# Patient Record
Sex: Female | Born: 1990 | Hispanic: Yes | Marital: Single | Smoking: Former smoker
Health system: Southern US, Community
[De-identification: ages and names within clinical notes are randomized; demographics above are authoritative.]

## PROBLEM LIST (undated history)

## (undated) DIAGNOSIS — L309 Dermatitis, unspecified: Secondary | ICD-10-CM

---
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
## 2008-12-05 ENCOUNTER — Emergency Department: Payer: Self-pay | Admitting: Emergency Medicine

## 2009-05-27 ENCOUNTER — Emergency Department: Payer: Self-pay | Admitting: Internal Medicine

## 2009-06-25 ENCOUNTER — Emergency Department: Payer: Self-pay | Admitting: Emergency Medicine

## 2009-07-01 ENCOUNTER — Emergency Department: Payer: Self-pay | Admitting: Emergency Medicine

## 2009-07-12 ENCOUNTER — Emergency Department: Payer: Self-pay

## 2009-12-05 ENCOUNTER — Emergency Department: Payer: Self-pay | Admitting: Emergency Medicine

## 2010-09-22 ENCOUNTER — Inpatient Hospital Stay: Payer: Self-pay | Admitting: Obstetrics and Gynecology

## 2011-07-21 ENCOUNTER — Emergency Department: Payer: Self-pay | Admitting: Emergency Medicine

## 2012-08-22 ENCOUNTER — Emergency Department: Payer: Self-pay | Admitting: Emergency Medicine

## 2012-08-22 LAB — RAPID INFLUENZA A&B ANTIGENS

## 2013-09-11 ENCOUNTER — Emergency Department: Payer: Self-pay | Admitting: Internal Medicine

## 2014-10-03 ENCOUNTER — Emergency Department: Payer: Self-pay | Admitting: Emergency Medicine

## 2014-10-21 ENCOUNTER — Emergency Department: Payer: Self-pay | Admitting: Emergency Medicine

## 2015-05-19 ENCOUNTER — Emergency Department
Admission: EM | Admit: 2015-05-19 | Discharge: 2015-05-19 | Disposition: A | Discharge status: Home / Self Care | Payer: Medicaid Other | Attending: Emergency Medicine | Admitting: Emergency Medicine

## 2015-05-19 DIAGNOSIS — L309 Dermatitis, unspecified: Secondary | ICD-10-CM | POA: Insufficient documentation

## 2015-05-19 DIAGNOSIS — Z720 Tobacco use: Secondary | ICD-10-CM | POA: Insufficient documentation

## 2015-05-19 DIAGNOSIS — Z91040 Latex allergy status: Secondary | ICD-10-CM | POA: Insufficient documentation

## 2015-05-19 HISTORY — DX: Dermatitis, unspecified: L30.9

## 2015-05-19 MED ORDER — TRIAMCINOLONE ACETONIDE 0.5 % EX OINT: 1.0000 "application " | TOPICAL_OINTMENT | Freq: Two times a day (BID) | CUTANEOUS | Status: DC

## 2015-05-19 NOTE — ED Provider Notes (Signed)
[facility] Emergency Department Provider Note  ____________________________________________  Time seen: Approximately 10:57 AM  I have reviewed the triage vital signs and the nursing notes.   HISTORY  Chief Complaint Rash   HPI Taylor Doyle is a 24 y.o. female is here complaining of bilateral hand rash. Patient states that she has had this before and came to the emergency room where she was prescribed a cream that helped with this. She has not seen a skin specialist in the past nor she have a primary care doctor. Patient states that this is been steadily increasing in the last month. She denies any fever or chills. There is been no drainage from these areas. She does have a history of eczema and impetigo.Rash constant and currently is confined to the dorsum of the hand and fingers. She states she uses some Neosporin ointment which did not relieve any of her symptoms. Currently she rates her discomfort is an 8 out of 10.   Past Medical History  Diagnosis Date  . Eczema     There are no active problems to display for this patient.   History reviewed. No pertinent past surgical history.  Current Outpatient Rx  Name  Route  Sig  Dispense  Refill  . triamcinolone ointment (KENALOG) 0.5 %   Topical   Apply 1 application topically 2 (two) times daily.   60 g   0     Allergies Latex  No family history on file.  Social History Social History  Substance Use Topics  . Smoking status: Current Every Day Smoker  . Smokeless tobacco: None  . Alcohol Use: No    Review of Systems Constitutional: No fever/chills Eyes: No visual changes. Cardiovascular: Denies chest pain. Respiratory: Denies shortness of breath. Gastrointestinal:  No nausea, no vomiting. Musculoskeletal: Negative for back pain. Skin: Positive for rash Neurological: Negative for headaches, focal weakness or numbness.  10-point ROS otherwise  negative.  ____________________________________________   PHYSICAL EXAM:  VITAL SIGNS: ED Triage Vitals  Enc Vitals Group     BP 05/19/15 1034 118/81 mmHg     Pulse Rate 05/19/15 1034 82     Resp 05/19/15 1034 16     Temp 05/19/15 1034 98.6 F (37 C)     Temp Source 05/19/15 1034 Oral     SpO2 05/19/15 1034 97 %     Weight 05/19/15 1034 170 lb (77.111 kg)     Height 05/19/15 1034 5' (1.524 m)     Head Cir --      Peak Flow --      Pain Score 05/19/15 1034 8     Pain Loc --      Pain Edu? --      Excl. in GC? --     Constitutional: Alert and oriented. Well appearing and in no acute distress. Eyes: Conjunctivae are normal. PERRL. EOMI. Head: Atraumatic. Nose: No congestion/rhinnorhea. Neck: No stridor.   Cardiovascular: Normal rate, regular rhythm. Grossly normal heart sounds.  Good peripheral circulation. Respiratory: Normal respiratory effort.  No retractions. Lungs CTAB. Gastrointestinal: Soft and nontender. No distention.  Musculoskeletal: No lower extremity tenderness nor edema.  No joint effusions. Neurologic:  Normal speech and language. No gross focal neurologic deficits are appreciated. No gait instability. Skin:  Skin is warm, dry.  Dorsum of the hands and fingers bilaterally are seen with thick, scaly macular areas. There is a dry appearance and feel to these areas. There is no active drainage noted and no warmth  or redness seen. Psychiatric: Mood and affect are normal. Speech and behavior are normal.  ____________________________________________   LABS (all labs ordered are listed, but only abnormal results are displayed)  Labs Reviewed - No data to display ____________________________________________   PROCEDURES  Procedure(s) performed: None  Critical Care performed: No  ____________________________________________   INITIAL IMPRESSION / ASSESSMENT AND PLAN / ED COURSE  Pertinent labs & imaging results that were available during my care of the  patient were reviewed by me and considered in my medical decision making (see chart for details).  Patient was placed on Kenalog 0.5% cream to apply to area twice a day. She is also given the name of [facility] to follow-up with for her eczema. ____________________________________________   FINAL CLINICAL IMPRESSION(S) / ED DIAGNOSES  Final diagnoses:  Eczema of both hands      Tommi Rumps, PA-C 05/19/15 1342  Tommi Rumps, PA-C 05/19/15 1343  Sharyn Creamer, MD 05/19/15 [phone]

## 2015-05-19 NOTE — ED Notes (Signed)
States her right hand swollen and tender  ..some rash and cracking noted..has been treated for same in past

## 2015-05-19 NOTE — ED Notes (Signed)
Pt c/o rash to BL hands with hx of impetigo and eczema

## 2015-05-19 NOTE — Discharge Instructions (Signed)
Hand Dermatitis Hand dermatitis is a skin problem. Small, itchy, raised dots or blisters appear on the palms of the hands. Hand dermatitis can last 3 to 4 weeks. HOME CARE  Avoid washing your hands too much.  Avoid all harsh chemicals. Wear gloves when you use products that can bother your skin.  Use medicated cream (1% hydrocortisone cream) at least 2 to 4 times per day.  Only take medicine as told by your doctor.  You may use wet cloths (compresses) or cold packs. GET HELP RIGHT AWAY IF:   The rash is not better after 1 week of treatment.  The area is red, tender, or yellowish-white fluid (pus) comes from the wound.  The rash is spreading. MAKE SURE YOU:   Understand these instructions.  Will watch your condition.  Will get help right away if you are not doing well or get worse.   This information is not intended to replace advice given to you by your health care provider. Make sure you discuss any questions you have with your health care provider.   Document Released: 10/16/2009 Document Revised: 10/14/2011 Document Reviewed: 02/03/2015 Elsevier Interactive Patient Education 2016 [employer]    Call for an appointment at [facility]. Use cream as directed and apply a very thin layer and rubbed completely into the skin.

## 2016-11-06 ENCOUNTER — Ambulatory Visit
Admission: EM | Admit: 2016-11-06 | Discharge: 2016-11-06 | Disposition: A | Discharge status: Home / Self Care | Payer: 59 | Attending: Family Medicine | Admitting: Family Medicine

## 2016-11-06 ENCOUNTER — Encounter: Payer: Self-pay | Admitting: Emergency Medicine

## 2016-11-06 ENCOUNTER — Ambulatory Visit (INDEPENDENT_AMBULATORY_CARE_PROVIDER_SITE_OTHER): Payer: 59

## 2016-11-06 DIAGNOSIS — J111 Influenza due to unidentified influenza virus with other respiratory manifestations: Secondary | ICD-10-CM

## 2016-11-06 DIAGNOSIS — J181 Lobar pneumonia, unspecified organism: Secondary | ICD-10-CM

## 2016-11-06 DIAGNOSIS — R69 Illness, unspecified: Secondary | ICD-10-CM

## 2016-11-06 DIAGNOSIS — J189 Pneumonia, unspecified organism: Secondary | ICD-10-CM

## 2016-11-06 LAB — RAPID STREP SCREEN (MED CTR MEBANE ONLY): Streptococcus, Group A Screen (Direct): NEGATIVE

## 2016-11-06 MED ORDER — BENZONATATE 100 MG PO CAPS: 100.0000 mg | ORAL_CAPSULE | Freq: Three times a day (TID) | ORAL | 0 refills | Status: DC | PRN

## 2016-11-06 MED ORDER — AZITHROMYCIN 250 MG PO TABS: ORAL_TABLET | ORAL | 0 refills | Status: DC

## 2016-11-06 MED ORDER — ACETAMINOPHEN 500 MG PO TABS
1000.0000 mg | ORAL_TABLET | Freq: Once | ORAL | Status: AC
  Administered 2016-11-06: 1000 mg via ORAL

## 2016-11-06 MED ORDER — HYDROCOD POLST-CPM POLST ER 10-8 MG/5ML PO SUER: 5.0000 mL | Freq: Every evening | ORAL | 0 refills | Status: DC | PRN

## 2016-11-06 NOTE — Discharge Instructions (Signed)
Take medication as prescribed. Rest. Drink plenty of fluids. Take over the counter tylenol or ibuprofen as needed.   Follow up with your primary care physician this week. Return to Urgent care or Emergency room for chest pain, shortness of breath, uncontrolled fevers, new or worsening concerns.

## 2016-11-06 NOTE — ED Provider Notes (Signed)
[facility] ____________________________________________  Time seen: Approximately 2:25 PM  I have reviewed the triage vital signs and the nursing notes.   HISTORY  Chief Complaint Generalized Body Aches and Cough  HPI Taylor Doyle is a 26 y.o. female presenting for the complaints of 4 days of runny nose, nasal congestion, chills, body aches and subjective fever. Patient reports has intermittently taking over-the-counter Robitussin and Tylenol with some improvement, no resolution. Reports has not taken any medications today for the same complaints. Patient reports over the last 2 days that she has had overall body aches. Patient reports when she lies flat she can hear what she feels like a gurgle in her chest. Patient reports she has also been having some soreness to her chest from frequent coughing. States soreness in her chest is to both sides, left worse than right and sore to direct palpation, movement as well as big deep breaths. Denies any chest pain at this time. Denies any pain at rest. Denies any wheezing or shortness of breath. States has been coughing up intermittent whitish yellowish phlegm as well as blowing her nose. States occasional sore throat, denies current sore throat. Denies known sick contacts. Denies hemoptysis.  Denies chest pain, shortness of breath, abdominal pain, dysuria, extremity pain, extremity swelling or rash. Denies recent sickness. Denies recent antibiotic use. Denies recent immobilization or sedentary changes. Denies leg swelling, cancer history, palpitations, shortness of breath. Denies any previous history of PE or DVT. Denies any family history of PE or DVT.  No LMP recorded. Patient has had an implant. Denies pregnancy.   Past Medical History:  Diagnosis Date  . Eczema   denies other past medical history  There are no active problems to display for this patient.   History reviewed. No pertinent surgical history.   No current  facility-administered medications for this encounter.   Current Outpatient Prescriptions:  .  etonogestrel (IMPLANON) 68 MG IMPL implant, 1 each by Subdermal route once., Disp: , Rfl:  .  azithromycin (ZITHROMAX Z-PAK) 250 MG tablet, Take 2 tablets (500 mg) on  Day 1,  followed by 1 tablet (250 mg) once daily on Days 2 through 5., Disp: 6 each, Rfl: 0 .  benzonatate (TESSALON PERLES) 100 MG capsule, Take 1 capsule (100 mg total) by mouth 3 (three) times daily as needed for cough., Disp: 15 capsule, Rfl: 0 .  chlorpheniramine-HYDROcodone (TUSSIONEX PENNKINETIC ER) 10-8 MG/5ML SUER, Take 5 mLs by mouth at bedtime as needed for cough. do not drive or operate machinery while taking as can cause drowsiness., Disp: 75 mL, Rfl: 0  Allergies Latex  History reviewed. No pertinent family history.  Social History Social History  Substance Use Topics  . Smoking status: Former Games developer  . Smokeless tobacco: Never Used  . Alcohol use No    Review of Systems Constitutional: As above. Eyes: No visual changes. ENT: Mild sore throat. Cardiovascular: Denies chest pain. Respiratory: Denies shortness of breath. Gastrointestinal: No abdominal pain.  No nausea, no vomiting.  No diarrhea.  No constipation. Genitourinary: Negative for dysuria. Musculoskeletal: Negative for back pain. Skin: Negative for rash. Neurological: Negative for focal weakness or numbness.   ____________________________________________   PHYSICAL EXAM:  VITAL SIGNS: ED Triage Vitals [11/06/16 1344]  Enc Vitals Group     BP 119/64     Pulse Rate (!) 116     Resp 16     Temp 99.7 F (37.6 C)     Temp Source Oral     SpO2  99 %     Weight 165 lb (74.8 kg)     Height 5' (1.524 m)     Head Circumference      Peak Flow      Pain Score 8     Pain Loc      Pain Edu?      Excl. in GC?    Vitals:   11/06/16 1344 11/06/16 1505  BP: 119/64   Pulse: (!) 116 96  Resp: 16   Temp: 99.7 F (37.6 C) 98.9 F (37.2 C)    TempSrc: Oral Oral  SpO2: 99%   Weight: 165 lb (74.8 kg)   Height: 5' (1.524 m)     Constitutional: Alert and oriented. Well appearing and in no acute distress. Eyes: Conjunctivae are normal. PERRL. EOMI. Head: Atraumatic. No sinus tenderness to palpation. No swelling. No erythema.  Ears: no erythema, normal TMs bilaterally.   Nose:Nasal congestion with clear rhinorrhea  Mouth/Throat: Mucous membranes are moist. Mild pharyngeal erythema. No tonsillar swelling or exudate.  Neck: No stridor.  No cervical spine tenderness to palpation. Hematological/Lymphatic/Immunilogical: No cervical lymphadenopathy. Cardiovascular: Normal rate, regular rhythm. Grossly normal heart sounds.  Good peripheral circulation. Respiratory: Normal respiratory effort.  No retractions. Good air movement. Mild left lower rhonchi. No wheezing. Dry intermittent cough noted in room. Speaks in complete sentences. Gastrointestinal: Soft and nontender.  No CVA tenderness. Musculoskeletal: Ambulatory with steady gait. No cervical, thoracic or lumbar tenderness to palpation. Bilateral lower extremities nontender, no edema noted. Bilateral pedal pulses equal and easily palpated. Neurologic:  Normal speech and language. No gait instability. Skin:  Skin appears warm, dry and intact. No rash noted. Psychiatric: Mood and affect are normal. Speech and behavior are normal. ___________________________________________   LABS (all labs ordered are listed, but only abnormal results are displayed)  Labs Reviewed  RAPID STREP SCREEN (NOT AT [facility])  CULTURE, GROUP A STREP [facility])   RADIOLOGY  Dg Chest 2 View  Result Date: 11/06/2016 CLINICAL DATA:  Cough. EXAM: CHEST  2 VIEW COMPARISON:  None. FINDINGS: The heart size and mediastinal contours are within normal limits. No pneumothorax or pleural effusion is noted. Right lung is clear. Mild left basilar atelectasis or infiltrate is noted. The visualized skeletal structures are  unremarkable. IMPRESSION: Left basilar atelectasis or pneumonia is noted. Electronically Signed   By: Lupita Raider, M.D.   On: 11/06/2016 14:43   ____________________________________________   PROCEDURES Procedures    INITIAL IMPRESSION / ASSESSMENT AND PLAN / ED COURSE  Pertinent labs & imaging results that were available during my care of the patient were reviewed by me and considered in my medical decision making (see chart for details).  Well-appearing patient. No acute distress. Discussed in detail patient suspect influenza-like illness. Patient does have rhonchi left lower lobe, discussed the patient will evaluate chest x-ray as well as strep swab. Discussed in detail with patient as patient reports chest soreness with deep breath but also present with muscular movements and palpation, suspect muscle strain and costochondritis however discussed etiologies including up to pulmonary embolism. Counseled regarding this. Discussed use of d-dimer testing, patient verbalized understanding of lab testing and use, and declines d-dimer testing. Will evaluate chest x-ray. 1 g of Tylenol given orally in urgent care.   Quick strep negative, will culture. Chest x-ray per radiologist's left basilar atelectasis or pneumonia noted. Discussed with patient concern for atypical pneumonia due to presentation of recent influenza-like illness. Will treat patient with oral azithromycin, when necessary Tessalon Perles  and when necessary Tussionex. Work note given for today and tomorrow. Encourage rest, fluids, supportive care, over-the-counter Tylenol or ibuprofen as needed. Patient does reports feeling somewhat better after Tylenol in urgent care. Encouraged patient to follow-up with her primary care physician this week.Discussed indication, risks and benefits of medications with patient. Discussed very strict follow-up and return parameters with patient.Discussed indication, risks and benefits of medications with  patient.  Discussed follow up with Primary care physician this week. Discussed follow up and return parameters including no resolution or any worsening concerns. Patient verbalized understanding and agreed to plan.   ____________________________________________   FINAL CLINICAL IMPRESSION(S) / ED DIAGNOSES  Final diagnoses:  Pneumonia of left lower lobe due to infectious organism [facility])  Influenza-like illness     Discharge Medication List as of 11/06/2016  2:58 PM    START taking these medications   Details  azithromycin (ZITHROMAX Z-PAK) 250 MG tablet Take 2 tablets (500 mg) on  Day 1,  followed by 1 tablet (250 mg) once daily on Days 2 through 5., Normal    benzonatate (TESSALON PERLES) 100 MG capsule Take 1 capsule (100 mg total) by mouth 3 (three) times daily as needed for cough., Starting Wed 11/06/2016, Normal    chlorpheniramine-HYDROcodone (TUSSIONEX PENNKINETIC ER) 10-8 MG/5ML SUER Take 5 mLs by mouth at bedtime as needed for cough. do not drive or operate machinery while taking as can cause drowsiness., Starting Wed 11/06/2016, Print        Note: This dictation was prepared with Dragon dictation along with smaller phrase technology. Any transcriptional errors that result from this process are unintentional.         Renford Dills, NP 11/06/16 1830

## 2016-11-06 NOTE — ED Triage Notes (Signed)
Patient c/o cough, chest congestion, and bodyaches that started on Saturday.

## 2016-11-09 LAB — CULTURE, GROUP A STREP (THRC)

## 2017-01-13 ENCOUNTER — Emergency Department: Payer: 59

## 2017-01-13 ENCOUNTER — Emergency Department
Admission: EM | Admit: 2017-01-13 | Discharge: 2017-01-13 | Disposition: A | Discharge status: Home / Self Care | Payer: 59 | Attending: Emergency Medicine | Admitting: Emergency Medicine

## 2017-01-13 ENCOUNTER — Encounter: Payer: Self-pay | Admitting: Emergency Medicine

## 2017-01-13 DIAGNOSIS — Z91040 Latex allergy status: Secondary | ICD-10-CM | POA: Diagnosis not present

## 2017-01-13 DIAGNOSIS — J069 Acute upper respiratory infection, unspecified: Secondary | ICD-10-CM | POA: Insufficient documentation

## 2017-01-13 DIAGNOSIS — R05 Cough: Secondary | ICD-10-CM | POA: Diagnosis present

## 2017-01-13 MED ORDER — HYDROCOD POLST-CPM POLST ER 10-8 MG/5ML PO SUER: 5.0000 mL | Freq: Two times a day (BID) | ORAL | 0 refills | Status: DC | PRN

## 2017-01-13 NOTE — ED Notes (Signed)
See triage note  Presents with nasal congestion,body aches and cough 2 days ago  Unsure of fever  Afebrile on arrival

## 2017-01-13 NOTE — ED Provider Notes (Signed)
[facility] Emergency Department Provider Note  ____________________________________________  Time seen: Approximately 1851  I have reviewed the triage vital signs and the nursing notes.   HISTORY  Chief Complaint Cough and Generalized Body Aches   HPI Taylor Doyle is a 26 y.o. female who presents to the emergency department for evaluationof nasal congestion, body aches, and cough that started yesterday morning. She does not believe that she has had a fever. She does report having had pneumonia approximately 6 weeks ago and has not had a follow-up chest x-ray. She has not taken anything for her symptoms today.   Past Medical History:  Diagnosis Date  . Eczema     There are no active problems to display for this patient.   No past surgical history on file.  Prior to Admission medications   Medication Sig Start Date End Date Taking? Authorizing Provider  azithromycin (ZITHROMAX Z-PAK) 250 MG tablet Take 2 tablets (500 mg) on  Day 1,  followed by 1 tablet (250 mg) once daily on Days 2 through 5. 11/06/16   Renford DillsMiller, Lindsey, NP  benzonatate (TESSALON PERLES) 100 MG capsule Take 1 capsule (100 mg total) by mouth 3 (three) times daily as needed for cough. 11/06/16   Renford DillsMiller, Lindsey, NP  chlorpheniramine-HYDROcodone [facility](TUSSIONEX PENNKINETIC ER) 10-8 MG/5ML SUER Take 5 mLs by mouth every 12 (twelve) hours as needed for cough. do not drive or operate machinery while taking as can cause drowsiness. 01/13/17   Elizabethann Lackey, Rulon Eisenmengerari B, FNP  etonogestrel (IMPLANON) 68 MG IMPL implant 1 each by Subdermal route once.    [provider]    Allergies Latex  No family history on file.  Social History Social History  Substance Use Topics  . Smoking status: Former Games developermoker  . Smokeless tobacco: Never Used  . Alcohol use No    Review of Systems Constitutional: Negative fever/chills ENT: Negative for sore throat. Cardiovascular: Denies chest pain. Respiratory:  Negative for shortness of breath. Positive for cough. Gastrointestinal: Negative for nausea,  no vomiting.  No diarrhea.  Musculoskeletal: Positive for body aches Skin: Negative for rash. Neurological: Negative for headaches ____________________________________________   PHYSICAL EXAM:  VITAL SIGNS: ED Triage Vitals  Enc Vitals Group     BP 01/13/17 1834 122/67     Pulse Rate 01/13/17 1834 97     Resp 01/13/17 1834 18     Temp 01/13/17 1834 98.6 F (37 C)     Temp Source 01/13/17 1834 Oral     SpO2 01/13/17 1834 99 %     Weight 01/13/17 1835 170 lb (77.1 kg)     Height 01/13/17 1835 5' (1.524 m)     Head Circumference --      Peak Flow --      Pain Score 01/13/17 1833 10     Pain Loc --      Pain Edu? --      Excl. in GC? --     Constitutional: Alert and oriented. Acutely ill appearing and in no acute distress. Eyes: Conjunctivae are normal. EOMI. Ears: Bilateral tympanic membranes are normal Nose: Pansinus congestion noted; clear rhinnorhea. Mouth/Throat: Mucous membranes are moist.  Oropharynx mildly erythematous. Tonsils not visualized. Neck: No stridor.  Lymphatic: No cervical lymphadenopathy. Cardiovascular: Normal rate, regular rhythm. Good peripheral circulation. Respiratory: Normal respiratory effort.  No retractions. Breath sounds clear to auscultation throughout. Gastrointestinal: Soft and nontender.  Musculoskeletal: FROM x 4 extremities.  Neurologic:  Normal speech and language.  Skin:  Skin is  warm, dry and intact. No rash noted. Psychiatric: Mood and affect are normal. Speech and behavior are normal.  ____________________________________________   LABS (all labs ordered are listed, but only abnormal results are displayed)  Labs Reviewed - No data to display ____________________________________________  EKG   ____________________________________________  RADIOLOGY  Chest x-ray negative for acute cardiopulmonary abnormality per  radiology. ____________________________________________   PROCEDURES  Procedure(s) performed: None  Critical Care performed: No ____________________________________________   INITIAL IMPRESSION / ASSESSMENT AND PLAN / ED COURSE  26 year-old female presenting to the emergency department for evaluation and treatment of symptoms consistent with a viral upper respiratory tract infection. She'll be treated with Tussionex and advised to follow-up with her primary care provider for symptoms that are not improving over the next few days. She was advised to return to the emergency department for symptoms that change or worsen if she is unable schedule an appointment.  Pertinent labs & imaging results that were available during my care of the patient were reviewed by me and considered in my medical decision making (see chart for details).  Discharge Medication List as of 01/13/2017  8:48 PM      If controlled substance prescribed during this visit, 12 month history viewed on the [facility] prior to issuing an initial prescription for Schedule II or III opiod. ____________________________________________   FINAL CLINICAL IMPRESSION(S) / ED DIAGNOSES  Final diagnoses:  Upper respiratory tract infection, unspecified type    Note:  This document was prepared using Dragon voice recognition software and may include unintentional dictation errors.     Chinita Pester, FNP 01/13/17 2345    Myrna Blazer, MD 01/14/17 [phone]

## 2017-01-13 NOTE — ED Triage Notes (Signed)
Pt reports cough, nasal congestion and body aches for two days.

## 2017-06-28 IMAGING — CR DG CHEST 2V
2 series · 2 of 2 positions shown · non-contrast
Comparison: 11/06/2016

CLINICAL DATA: Nasal congestion with body aches and cough 2 days.

EXAM:
CHEST  2 VIEW

[chest pa]
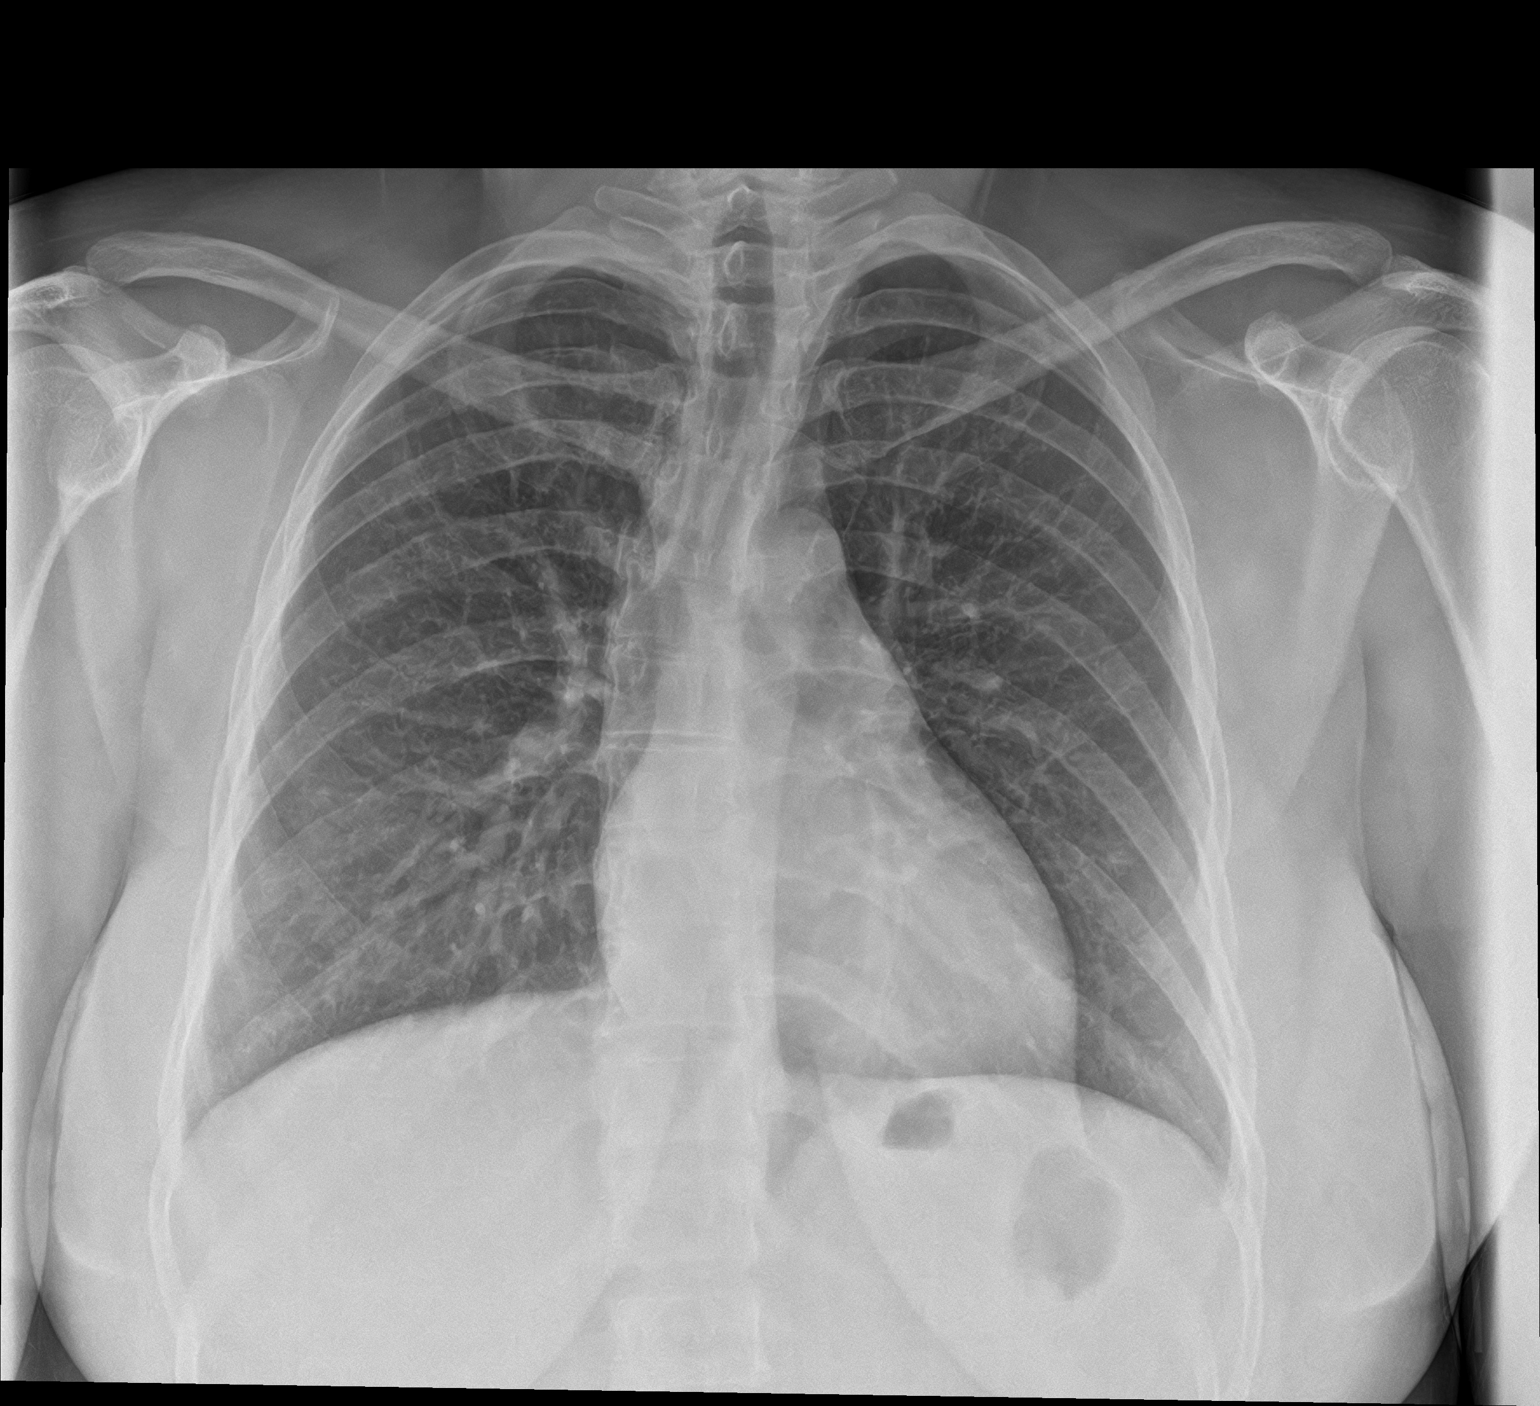

[chest lat]
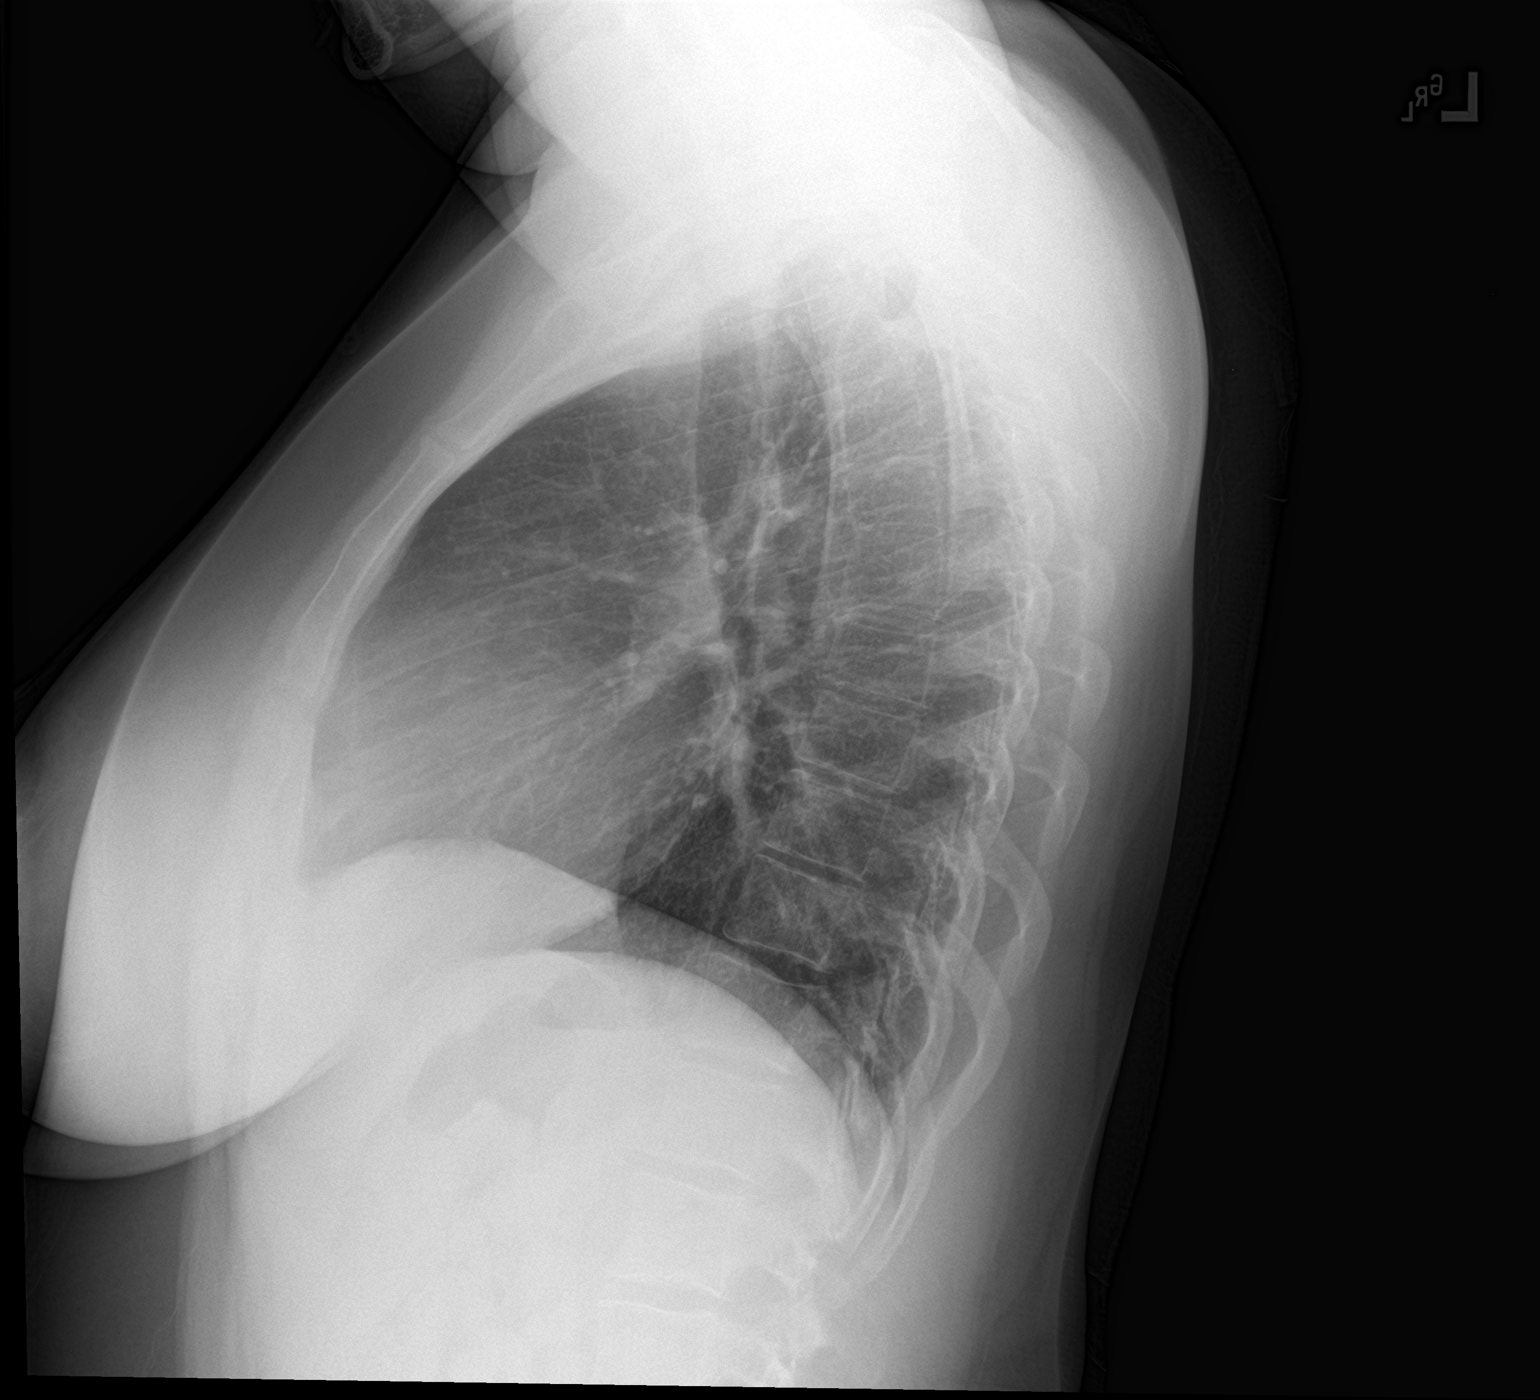

[2 of 2 positions shown; findings below may reference images not displayed]

FINDINGS: Lungs are adequately inflated and otherwise clear. Cardiomediastinal
silhouette is within normal. Bones and soft tissues unchanged.
IMPRESSION: No active cardiopulmonary disease.

## 2017-10-03 ENCOUNTER — Other Ambulatory Visit: Payer: Self-pay | Admitting: Obstetrics and Gynecology

## 2017-10-03 DIAGNOSIS — N6452 Nipple discharge: Secondary | ICD-10-CM

## 2019-07-22 DIAGNOSIS — Z20828 Contact with and (suspected) exposure to other viral communicable diseases: Secondary | ICD-10-CM | POA: Diagnosis not present

## 2019-08-12 DIAGNOSIS — Z20828 Contact with and (suspected) exposure to other viral communicable diseases: Secondary | ICD-10-CM | POA: Diagnosis not present

## 2019-09-16 DIAGNOSIS — Z20828 Contact with and (suspected) exposure to other viral communicable diseases: Secondary | ICD-10-CM | POA: Diagnosis not present

## 2020-02-18 DIAGNOSIS — E6609 Other obesity due to excess calories: Secondary | ICD-10-CM | POA: Diagnosis not present

## 2020-02-18 DIAGNOSIS — Z136 Encounter for screening for cardiovascular disorders: Secondary | ICD-10-CM | POA: Diagnosis not present

## 2020-02-18 DIAGNOSIS — Z131 Encounter for screening for diabetes mellitus: Secondary | ICD-10-CM | POA: Diagnosis not present

## 2020-02-18 DIAGNOSIS — Z0001 Encounter for general adult medical examination with abnormal findings: Secondary | ICD-10-CM | POA: Diagnosis not present

## 2020-02-18 DIAGNOSIS — Z1329 Encounter for screening for other suspected endocrine disorder: Secondary | ICD-10-CM | POA: Diagnosis not present

## 2020-02-18 DIAGNOSIS — R1013 Epigastric pain: Secondary | ICD-10-CM | POA: Diagnosis not present

## 2020-03-01 DIAGNOSIS — E782 Mixed hyperlipidemia: Secondary | ICD-10-CM | POA: Diagnosis not present

## 2020-03-01 DIAGNOSIS — E6609 Other obesity due to excess calories: Secondary | ICD-10-CM | POA: Diagnosis not present

## 2020-03-01 DIAGNOSIS — R1013 Epigastric pain: Secondary | ICD-10-CM | POA: Diagnosis not present

## 2020-03-16 DIAGNOSIS — Z124 Encounter for screening for malignant neoplasm of cervix: Secondary | ICD-10-CM | POA: Diagnosis not present

## 2020-03-16 DIAGNOSIS — Z113 Encounter for screening for infections with a predominantly sexual mode of transmission: Secondary | ICD-10-CM | POA: Diagnosis not present

## 2020-03-16 DIAGNOSIS — N898 Other specified noninflammatory disorders of vagina: Secondary | ICD-10-CM | POA: Diagnosis not present

## 2020-03-16 DIAGNOSIS — Z01411 Encounter for gynecological examination (general) (routine) with abnormal findings: Secondary | ICD-10-CM | POA: Diagnosis not present

## 2020-06-20 DIAGNOSIS — Z3490 Encounter for supervision of normal pregnancy, unspecified, unspecified trimester: Secondary | ICD-10-CM | POA: Diagnosis not present

## 2020-06-20 DIAGNOSIS — N941 Unspecified dyspareunia: Secondary | ICD-10-CM | POA: Diagnosis not present

## 2020-07-17 DIAGNOSIS — Z3481 Encounter for supervision of other normal pregnancy, first trimester: Secondary | ICD-10-CM | POA: Diagnosis not present

## 2020-07-31 DIAGNOSIS — O99810 Abnormal glucose complicating pregnancy: Secondary | ICD-10-CM | POA: Diagnosis not present

## 2020-08-15 DIAGNOSIS — Z23 Encounter for immunization: Secondary | ICD-10-CM | POA: Diagnosis not present

## 2020-08-29 DIAGNOSIS — Z113 Encounter for screening for infections with a predominantly sexual mode of transmission: Secondary | ICD-10-CM | POA: Diagnosis not present

## 2020-08-29 DIAGNOSIS — Z3401 Encounter for supervision of normal first pregnancy, first trimester: Secondary | ICD-10-CM | POA: Diagnosis not present

## 2020-11-24 DIAGNOSIS — Z23 Encounter for immunization: Secondary | ICD-10-CM | POA: Diagnosis not present

## 2020-12-08 DIAGNOSIS — O99810 Abnormal glucose complicating pregnancy: Secondary | ICD-10-CM | POA: Diagnosis not present

## 2023-12-02 ENCOUNTER — Encounter: Payer: Self-pay | Admitting: Dermatology

## 2023-12-02 ENCOUNTER — Ambulatory Visit (INDEPENDENT_AMBULATORY_CARE_PROVIDER_SITE_OTHER): Payer: 59 | Admitting: Dermatology

## 2023-12-02 VITALS — BP 103/69 | HR 101

## 2023-12-02 DIAGNOSIS — L300 Nummular dermatitis: Secondary | ICD-10-CM

## 2023-12-02 DIAGNOSIS — L209 Atopic dermatitis, unspecified: Secondary | ICD-10-CM

## 2023-12-02 MED ORDER — CLOBETASOL PROPIONATE 0.05 % EX OINT: 1.0000 | TOPICAL_OINTMENT | Freq: Two times a day (BID) | CUTANEOUS | 4 refills | Status: DC

## 2023-12-02 MED ORDER — PIMECROLIMUS 1 % EX CREA: TOPICAL_CREAM | Freq: Two times a day (BID) | CUTANEOUS | 2 refills | Status: DC

## 2023-12-02 NOTE — Patient Instructions (Signed)

## 2023-12-02 NOTE — Progress Notes (Signed)
   New Patient Visit   Subjective  Taylor Doyle is a 33 y.o. female who presents for the following: Concerned about eczema on her hands. 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 Previously treated with steroid shots, triamcinolone  and clobetasol and failed therapy.  States she has had eczema since childhood. 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    The following portions of the chart were reviewed this encounter and updated as appropriate: medications, allergies, medical history  Review of Systems:  No other skin or systemic complaints except as noted in HPI or Assessment and Plan.  Objective  Well appearing patient in no apparent distress; mood and affect are within normal limits.  A focused examination was performed of the following areas: Hands and face  Relevant exam findings are noted in the Assessment and Plan.    Assessment & Plan    ATOPIC DERMATITIS Exam: Scaly pink papules coalescing to plaques 5% BSA  Flared  Atopic dermatitis (eczema) is a chronic, relapsing, pruritic condition that can significantly affect quality of life. It is often associated with allergic rhinitis and/or asthma and can require treatment with topical medications, phototherapy, or in severe cases biologic injectable medication (Dupixent; Adbry) or Oral JAK inhibitors.  Treatment Plan: Elidel BID  Continue clobetasol If she fails by next visit we will proceed with Dupixent  Recommend gentle skin care.  NUMMULAR ECZEMA Left Hand - Anterior, Left Hand - Posterior, Right Hand - Anterior, Right Hand - Posterior Related Medications pimecrolimus (ELIDEL) 1 % cream Apply topically 2 (two) times daily. clobetasol ointment (TEMOVATE) 0.05 % Apply 1 Application topically 2 (two) times daily.  Return in about 1 month (around 01/01/2024) for F/u up with Dr. Myrtie Atkinson.  I, Haig Levan, Surg Tech III, am acting as scribe for Deneise Finlay, MD.    Documentation: I have reviewed the above documentation for accuracy and completeness, and I agree with the above.  Deneise Finlay, MD

## 2023-12-24 ENCOUNTER — Other Ambulatory Visit: Payer: Self-pay

## 2023-12-24 DIAGNOSIS — L300 Nummular dermatitis: Secondary | ICD-10-CM

## 2023-12-24 MED ORDER — CLOBETASOL PROPIONATE 0.05 % EX OINT: 1.0000 | TOPICAL_OINTMENT | Freq: Two times a day (BID) | CUTANEOUS | 4 refills | Status: DC

## 2023-12-24 MED ORDER — PIMECROLIMUS 1 % EX CREA: TOPICAL_CREAM | Freq: Two times a day (BID) | CUTANEOUS | 2 refills | Status: DC

## 2024-01-14 ENCOUNTER — Encounter: Payer: Self-pay | Admitting: Dermatology

## 2024-01-14 ENCOUNTER — Ambulatory Visit (INDEPENDENT_AMBULATORY_CARE_PROVIDER_SITE_OTHER): Admitting: Dermatology

## 2024-01-14 VITALS — BP 102/69

## 2024-01-14 DIAGNOSIS — L309 Dermatitis, unspecified: Secondary | ICD-10-CM | POA: Diagnosis not present

## 2024-01-14 DIAGNOSIS — L300 Nummular dermatitis: Secondary | ICD-10-CM

## 2024-01-14 MED ORDER — BETAMETHASONE DIPROPIONATE 0.05 % EX CREA: TOPICAL_CREAM | Freq: Two times a day (BID) | CUTANEOUS | 3 refills | Status: AC

## 2024-01-14 MED ORDER — TACROLIMUS 0.1 % EX OINT: TOPICAL_OINTMENT | CUTANEOUS | 3 refills | Status: AC

## 2024-01-14 NOTE — Patient Instructions (Addendum)
 Date: Wed Jan 14 2024  Dear Ms. Taylor Doyle,  Thank you for visiting today. Here is a summary of the key instructions:  - Medications:   - Betamethasone cream for hand dermatitis flares     - Apply for 2 weeks, then take a break   - Tacrolimus cream     - Alternate with betamethasone every 2 weeks until clear  - Skin Care Instructions:   - Wear gloves when washing dishes and cleaning   - Use lukewarm water if not wearing gloves   - Apply moisturizing cream after washing hands   - Limit use of alcohol-based hand sanitizers   - Use warm water, soap, and moisturizer instead of sanitizers  - Follow-up:   - Return for follow-up appointment in 8 weeks   - If hand dermatitis is not clear, Dupixent may be considered  - Additional Instructions:   - Send a message through MyChart if you have issues with prescriptions   - Be mindful of everything you touch to avoid triggering hand dermatitis  Please reach out if you have any questions or concerns.  Best regards,  Dr. Louana Roup [facility]   Important Information  Due to recent changes in healthcare laws, you may see results of your pathology and/or laboratory studies on MyChart before the doctors have had a chance to review them. We understand that in some cases there may be results that are confusing or concerning to you. Please understand that not all results are received at the same time and often the doctors may need to interpret multiple results in order to provide you with the best plan of care or course of treatment. Therefore, we ask that you please give us  2 business days to thoroughly review all your results before contacting the office for clarification. Should we see a critical lab result, you will be contacted sooner.   If You Need Anything After Your Visit  If you have any questions or concerns for your doctor, please call our main line at [phone] If no one answers, please leave a voicemail as directed and we will  return your call as soon as possible. Messages left after 4 pm will be answered the following business day.   You may also send us  a message via MyChart. We typically respond to MyChart messages within 1-2 business days.  For prescription refills, please ask your pharmacy to contact our office. Our fax number is [phone].  If you have an urgent issue when the clinic is closed that cannot wait until the next business day, you can page your doctor at the number below.    Please note that while we do our best to be available for urgent issues outside of office hours, we are not available 24/7.   If you have an urgent issue and are unable to reach us , you may choose to seek medical care at your doctor's office, retail clinic, urgent care center, or emergency room.  If you have a medical emergency, please immediately call 911 or go to the emergency department. In the event of inclement weather, please call our main line at [phone] for an update on the status of any delays or closures.  Dermatology Medication Tips: Please keep the boxes that topical medications come in in order to help keep track of the instructions about where and how to use these. Pharmacies typically print the medication instructions only on the boxes and not directly on the medication tubes.   If your medication is  too expensive, please contact our office at [phone] or send us  a message through MyChart.   We are unable to tell what your co-pay for medications will be in advance as this is different depending on your insurance coverage. However, we may be able to find a substitute medication at lower cost or fill out paperwork to get insurance to cover a needed medication.   If a prior authorization is required to get your medication covered by your insurance company, please allow us  1-2 business days to complete this process.  Drug prices often vary depending on where the prescription is filled and some pharmacies  may offer cheaper prices.  The website www.goodrx.com contains coupons for medications through different pharmacies. The prices here do not account for what the cost may be with help from insurance (it may be cheaper with your insurance), but the website can give you the price if you did not use any insurance.  - You can print the associated coupon and take it with your prescription to the pharmacy.  - You may also stop by our office during regular business hours and pick up a GoodRx coupon card.  - If you need your prescription sent electronically to a different pharmacy, notify our office through [facility] or by phone at [phone]

## 2024-01-14 NOTE — Progress Notes (Signed)
   Follow-Up Visit   Subjective  Taylor Doyle is a 33 y.o. female who presents for the following: Nummular Eczema  Patient present today for follow up visit. Patient was last evaluated on 12/02/23 by Dr .Paci. At this visit patient was prescribed:  Pimecrolimus  - apply BID Clobetasol  - BID for 2 weeks, then stop for 2 weeks.   Patient said she was not able to obtain either Rx due to insurance stating a PA is needed.   Patient reports sxs are unchanged. Patient denies medication changes.   The following portions of the chart were reviewed this encounter and updated as appropriate: medications, allergies, medical history  Review of Systems:  No other skin or systemic complaints except as noted in HPI or Assessment and Plan.  Objective  Well appearing patient in no apparent distress; mood and affect are within normal limits.   A focused examination was performed of the following areas: hands   Relevant exam findings are noted in the Assessment and Plan.             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 Previous prescriptions for clobetasol  and pimecrolimus  were not covered by insurance, necessitating medication changes. The condition is persistent and requires ongoing management with topical treatments.  - Plan:    Adjust topical regimen:     - Switch to betamethasone cream (covered by Medicaid) as a substitute for clobetasol      - Prescribe tacrolimus as an alternative to pimecrolimus      - Alternate betamethasone and tacrolimus every 2 weeks until clear     - Use steroid cream (betamethasone) early in a flare for up to 2 weeks, then take a break    Consider future treatment options:     - If creams are ineffective after 6-12 weeks, consider Dupixent (injectable monoclonal antibody)    Patient education:      - Wear gloves when washing dishes and cleaning     - If gloves unavailable, use lukewarm water and avoid touching detergent directly     - Apply moisturizing cream after washing hands     - Limit contact with alcohol-based hand sanitizers     - Use warm water, soap, and moisturizer instead of hand sanitizers when possible    Medication management:     - Send new prescriptions to patient's pharmacy    Follow-up:     - Appointment in 8 weeks     - Assess effectiveness of current treatment     - Consider Dupixent if condition not clear  NUMMULAR ECZEMA   Related Medications betamethasone dipropionate 0.05 % cream Apply topically 2 (two) times daily. Use on affected areas for 2 weeks, then break for 2 weeks. tacrolimus (PROTOPIC) 0.1 % ointment Use BID on affected areas for 2 weeks while breaking from betamethasone.  Return in about 8 weeks (around 03/10/2024) for nummular eczema.   Documentation: I have reviewed the above documentation for accuracy and completeness, and I agree with the above.   I, Shirron Louanne Roussel, CMA, am acting as scribe for [employer], DO.   Louana Roup, DO

## 2024-01-19 ENCOUNTER — Encounter: Payer: Self-pay | Admitting: Dermatology

## 2024-03-24 ENCOUNTER — Encounter: Payer: Self-pay | Admitting: Dermatology

## 2024-03-24 ENCOUNTER — Ambulatory Visit: Admitting: Dermatology

## 2024-03-24 DIAGNOSIS — L300 Nummular dermatitis: Secondary | ICD-10-CM

## 2024-03-24 DIAGNOSIS — L299 Pruritus, unspecified: Secondary | ICD-10-CM

## 2024-03-24 MED ORDER — OPZELURA 1.5 % EX CREA: TOPICAL_CREAM | CUTANEOUS | 4 refills | Status: AC

## 2024-03-24 NOTE — Patient Instructions (Addendum)
 Date: Wed Mar 24 2024  Hello Taylor Doyle,  Thank you for visiting today. Here is a summary of the key instructions:  - Medications:   - Apply Opzelura  cream to affected areas daily until clear, then every other night   - Use a pea-sized amount   - At night, put Vaseline on top after applying Opzelura    - Continue using tacrolimus  and clobetasol  until switching to Opzelura    - Use the Opzelura  sample provided  - Skin Care:   - Apply cream 30 minutes before washing hands   - Apply cream at night after showering   - Wear gloves when washing dishes or cleaning  - Follow-up:   - Return for follow-up appointment in 3 months  - Other Instructions:   - New prescription for Opzelura  will be sent to [facility] pharmacy  Please reach out if you have any questions or concerns.  Warm regards,  Dr. Delon Lenis [facility]at[facility]  Important Information  Due to recent changes in healthcare laws, you may see results of your pathology and/or laboratory studies on MyChart before the doctors have had a chance to review them. We understand that in some cases there may be results that are confusing or concerning to you. Please understand that not all results are received at the same time and often the doctors may need to interpret multiple results in order to provide you with the best plan of care or course of treatment. Therefore, we ask that you please give us  2 business days to thoroughly review all your results before contacting the office for clarification. Should we see a critical lab result, you will be contacted sooner.   If You Need Anything After Your Visit  If you have any questions or concerns for your doctor, please call our main line at [phone] If no one answers, please leave a voicemail as directed and we will return your call as soon as possible. Messages left after 4 pm will be answered the following business day.   You may also send us  a message via MyChart. We typically respond to  MyChart messages within 1-2 business days.  For prescription refills, please ask your pharmacy to contact our office. Our fax number is [phone].  If you have an urgent issue when the clinic is closed that cannot wait until the next business day, you can page your doctor at the number below.    Please note that while we do our best to be available for urgent issues outside of office hours, we are not available 24/7.   If you have an urgent issue and are unable to reach us , you may choose to seek medical care at your doctor's office, retail clinic, urgent care center, or emergency room.  If you have a medical emergency, please immediately call 911 or go to the emergency department. In the event of inclement weather, please call our main line at [phone] for an update on the status of any delays or closures.  Dermatology Medication Tips: Please keep the boxes that topical medications come in in order to help keep track of the instructions about where and how to use these. Pharmacies typically print the medication instructions only on the boxes and not directly on the medication tubes.   If your medication is too expensive, please contact our office at [phone] or send us  a message through MyChart.   We are unable to tell what your co-pay for medications will be in advance as this is different depending on  your insurance coverage. However, we may be able to find a substitute medication at lower cost or fill out paperwork to get insurance to cover a needed medication.   If a prior authorization is required to get your medication covered by your insurance company, please allow us  1-2 business days to complete this process.  Drug prices often vary depending on where the prescription is filled and some pharmacies may offer cheaper prices.  The website www.goodrx.com contains coupons for medications through different pharmacies. The prices here do not account for what the cost may be with  help from insurance (it may be cheaper with your insurance), but the website can give you the price if you did not use any insurance.  - You can print the associated coupon and take it with your prescription to the pharmacy.  - You may also stop by our office during regular business hours and pick up a GoodRx coupon card.  - If you need your prescription sent electronically to a different pharmacy, notify our office through [facility] or by phone at [phone]

## 2024-03-24 NOTE — Progress Notes (Signed)
   Follow-Up Visit   Subjective  Taylor Doyle is a 33 y.o. female established patient who presents for FOLLOW UP on the diagnoses listed below:  Patient was last evaluated on 01/14/24.   Nummular Eczema: Continue alternating betamethasone  and tacrolimus  every 2 weeks. Patient reports sxs are better - however she has had to apply more of the topicals for prevention due frequent handwashing at work. Itch/Pain Score 4 out of 10 day to day but if noticed that is she is stressed or anxious she flares more.   Are you nursing, pregnant or trying to conceive? No   The following portions of the chart were reviewed this encounter and updated as appropriate: medications, allergies, medical history  Review of Systems:  No other skin or systemic complaints except as noted in HPI or Assessment and Plan.  Objective  Well appearing patient in no apparent distress; mood and affect are within normal limits.   A focused examination was performed of the following areas: B/L hands   Relevant exam findings are noted in the Assessment and Plan.          Assessment & Plan    1. Hand Dermatitis - Assessment: Patient presents with ongoing hand dermatitis, primarily affecting the thumbs. There has been some improvement, but the condition persists. Current treatment includes topical medications, which have been partially effective. The patient reports difficulty with consistent application due to frequent hand washing, especially during work. The condition appears to be exacerbated by exposure to water and detergents. IGA 3, Itch 8/10, Special Site: bilateral hands  - Plan:    Discontinue current topical medications (tacrolimus  and clobetasol ) upon initiation of new treatment    Prescribe Opzelura  (topical JAK inhibitor)    Apply pea-sized amount to affected areas daily until clear, then every other night for maintenance    Apply Vaseline on top at night for occlusion    Submit prior authorization  for Opzelura  through [facility]    Provide sample of Opzelura  for immediate use    If Opzelura  is effective and clears skin, switch to tacrolimus  nightly for maintenance    Recommend use of protective gloves for dishwashing and cleaning activities    If skin not clear at follow-up, consider Dupixent as next treatment option  Follow-up in 3 months to assess progress.     No follow-ups on file.   Documentation: I have reviewed the above documentation for accuracy and completeness, and I agree with the above.  I, Shirron Maranda, CMA, am acting as scribe for [employer], DO.   Delon Lenis, DO

## 2024-06-22 ENCOUNTER — Encounter: Payer: Self-pay | Admitting: Dermatology

## 2024-06-22 ENCOUNTER — Ambulatory Visit: Admitting: Dermatology

## 2024-06-22 VITALS — BP 111/73 | HR 79

## 2024-06-22 DIAGNOSIS — L299 Pruritus, unspecified: Secondary | ICD-10-CM

## 2024-06-22 DIAGNOSIS — L209 Atopic dermatitis, unspecified: Secondary | ICD-10-CM | POA: Diagnosis not present

## 2024-06-22 DIAGNOSIS — Z7189 Other specified counseling: Secondary | ICD-10-CM

## 2024-06-22 MED ORDER — DUPIXENT 300 MG/2ML ~~LOC~~ SOAJ: 600.0000 mg | Freq: Once | SUBCUTANEOUS | 0 refills | Status: AC

## 2024-06-22 MED ORDER — DUPIXENT 300 MG/2ML ~~LOC~~ SOAJ: 300.0000 mg | SUBCUTANEOUS | 11 refills | Status: AC

## 2024-06-22 NOTE — Progress Notes (Signed)
 Follow-Up Visit   Subjective  Taylor Doyle is a 33 y.o. female who presents for the following: Nummular Eczema  Patient present today for follow up visit for Nummular Eczema. Patient was last evaluated on 03/24/2024. At this visit patient was prescribed Opzelura . She reports she is still applying the Opzelura  2 times daily. 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 She feels hair products and synthetic hairs cause her hands to become flared. Patient reports sxs are improving but not at goal. She has a new spot that has appeared in October on her left forearm. She states that area can be very itchy. She rates her itch 5-6 out of 10. Patient denies medication changes.  Patient reports she is not actively pregnant, trying to conceive or nursing.  Patient provided verbal consent for the use of an AI-assisted program to generate a detailed after-visit summary. The patient understands that the AI tool is used to support clinical documentation and that all information will be reviewed and verified by the healthcare provider.  Patient has tried and failed the following medications: - Betamethasone  01/2024 - 03/2024 - Clobetasol  Ointment 0.05% 11/2023 - 06/2024 -Tacrolimus  Ointment 0.1% 01/2024 - 03/2024 - Elidel  (Pimecrolimus ) 12/2023 - 03/2024 - Opzelura  Cream 03/2024 - Current - Triamcinolone  ointment 05/2015 - 11/2016  The following portions of the chart were reviewed this encounter and updated as appropriate: medications, allergies, medical history  Review of Systems:  No other skin or systemic complaints except as noted in HPI or Assessment and Plan.  Objective  Well appearing patient in no apparent distress; mood and affect are within normal limits.  A focused examination was performed of the following areas: Hands and Left Forearm  Relevant exam findings are noted in the Assessment and Plan.                      Assessment & Plan   HAND DERMATITIS Exam: Scaly  pink plaques +fissures BSA:*** IGA:***  Flared  Hand Dermatitis is a chronic type of eczema that can come and go on the hands and fingers.  While there is no cure, the rash and symptoms can be managed with topical prescription medications, and for more severe cases, with systemic medications.  Recommend mild soap and routine use of moisturizing cream after handwashing.  Minimize soap/water exposure when possible.    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 Patient has been unresponsive to aggressive topical therapy.  Failed Treatments: Topical Steroids and Topical Protopic   Treatment Protocol: 600 mg [place] day 0 then 300 mg [place] every other week  Specific Contraindications Cyclosporine is contraindicated because the patient will not be able to complete the necessary follow-up labs. Methotrexate is contraindicated because the patient will not be able to complete the necessary follow-up labs. Phototherapy is contraindicated because the patient lives too far from the treatment location.  Dupixent  Counseling: I discussed with the patient the risks of dupilumab  including but not limited to eye infection and irritation, cold sores, injection site reactions, worsening of asthma, allergic reactions and increased risk of parasitic infection. Live vaccines should be avoided while taking  dupilumab . Dupilumab  will also interact with certain medications such as warfarin and cyclosporine. The patient understands that monitoring is required and they must alert us  or the primary physician if symptoms of infection or  other concerning signs are noted.  Dupixent  Monitoring: There is no laboratory monitoring requirement with Dupixent . ATOPIC DERMATITIS, UNSPECIFIED TYPE   Related Medications Dupilumab  (DUPIXENT ) 300 MG/2ML SOAJ Inject 600 mg into the skin once for 1 dose. On day 1. Dupilumab  (DUPIXENT ) 300 MG/2ML SOAJ Inject 300 mg into the skin every 14 (fourteen) days. Starting at day 15 for maintenance.  Return in 6 months (on 12/20/2024) for Nummular Eczema F/U.  I, Jetta Ager, am acting as neurosurgeon for [employer], DO.  Documentation: I have reviewed the above documentation for accuracy and completeness, and I agree with the above.  Delon Lenis, DO

## 2024-06-22 NOTE — Patient Instructions (Addendum)
 VISIT SUMMARY:  Today, we discussed your chronic eczema, focusing on the persistent hand dermatitis and a new itchy patch on your left forearm. We reviewed your current treatment with Opzelura  and explored additional options to manage your symptoms.  YOUR PLAN:  -ATOPIC DERMATITIS (ECZEMA):  Eczema is a condition that makes your skin red, inflamed, and itchy. It can be triggered by various factors, including irritants and allergens.   We will continue your current treatment with Opzelura , a topical medication, and have prescribed Dupixent, an injectable medication that can help in severe cases.   We discussed the potential side effects of Dupixent, such as eye inflammation and cold sores. It's important to use barrier protection like gloves when handling synthetic hair products and to keep your skin moisturized.   I provided samples of Neutrogena Norwegian hand cream and Aquaphor body balm for this purpose.   Additionally, you can use Eucerin eczema therapy and Aveeno itch relief balm for spot treatment. After applying moisturizer, lock in the moisture with Aquaphor spray.  INSTRUCTIONS:  Please continue using Opzelura  daily and start using Dupixent as prescribed. Make sure to wear gloves when working with synthetic hair products to prevent further irritation. Use the provided samples and recommended products to keep your skin moisturized. We will follow up in a couple of months to see how you are responding to the treatment.          Important Information   Due to recent changes in healthcare laws, you may see results of your pathology and/or laboratory studies on MyChart before the doctors have had a chance to review them. We understand that in some cases there may be results that are confusing or concerning to you. Please understand that not all results are received at the same time and often the doctors may need to interpret multiple results in order to provide you with the best  plan of care or course of treatment. Therefore, we ask that you please give us  2 business days to thoroughly review all your results before contacting the office for clarification. Should we see a critical lab result, you will be contacted sooner.     If You Need Anything After Your Visit   If you have any questions or concerns for your doctor, please call our main line at [phone]. If no one answers, please leave a voicemail as directed and we will return your call as soon as possible. Messages left after 4 pm will be answered the following business day.    You may also send us  a message via MyChart. We typically respond to MyChart messages within 1-2 business days.  For prescription refills, please ask your pharmacy to contact our office. Our fax number is [phone].  If you have an urgent issue when the clinic is closed that cannot wait until the next business day, you can page your doctor at the number below.     Please note that while we do our best to be available for urgent issues outside of office hours, we are not available 24/7.    If you have an urgent issue and are unable to reach us , you may choose to seek medical care at your doctor's office, retail clinic, urgent care center, or emergency room.   If you have a medical emergency, please immediately call 911 or go to the emergency department. In the event of inclement weather, please call our main line at [phone] for an update on the status of any delays or closures.  Dermatology Medication Tips: Please keep the boxes that topical medications come in in order to help keep track of the instructions about where and how to use these. Pharmacies typically print the medication instructions only on the boxes and not directly on the medication tubes.   If your medication is too expensive, please contact our office at [phone] or send us  a message through MyChart.    We are unable to tell what your co-pay for medications  will be in advance as this is different depending on your insurance coverage. However, we may be able to find a substitute medication at lower cost or fill out paperwork to get insurance to cover a needed medication.    If a prior authorization is required to get your medication covered by your insurance company, please allow us  1-2 business days to complete this process.   Drug prices often vary depending on where the prescription is filled and some pharmacies may offer cheaper prices.   The website www.goodrx.com contains coupons for medications through different pharmacies. The prices here do not account for what the cost may be with help from insurance (it may be cheaper with your insurance), but the website can give you the price if you did not use any insurance.  - You can print the associated coupon and take it with your prescription to the pharmacy.  - You may also stop by our office during regular business hours and pick up a GoodRx coupon card.  - If you need your prescription sent electronically to a different pharmacy, notify our office through [facility] or by phone at [phone]

## 2024-12-28 ENCOUNTER — Ambulatory Visit: Admitting: Dermatology
